# Patient Record
Sex: Male | Born: 1970 | Race: Asian | Hispanic: No | Marital: Married | Smoking: Never smoker
Health system: Southern US, Community
[De-identification: ages and names within clinical notes are randomized; demographics above are authoritative.]

## PROBLEM LIST (undated history)

## (undated) DIAGNOSIS — E7800 Pure hypercholesterolemia, unspecified: Secondary | ICD-10-CM

---

## 2014-07-02 ENCOUNTER — Emergency Department (HOSPITAL_BASED_OUTPATIENT_CLINIC_OR_DEPARTMENT_OTHER): Payer: BLUE CROSS/BLUE SHIELD

## 2014-07-02 ENCOUNTER — Encounter (HOSPITAL_BASED_OUTPATIENT_CLINIC_OR_DEPARTMENT_OTHER): Payer: Self-pay | Admitting: *Deleted

## 2014-07-02 ENCOUNTER — Emergency Department (HOSPITAL_BASED_OUTPATIENT_CLINIC_OR_DEPARTMENT_OTHER)
Admission: EM | Admit: 2014-07-02 | Discharge: 2014-07-02 | Disposition: A | Discharge status: Home / Self Care | Payer: BLUE CROSS/BLUE SHIELD | Attending: Emergency Medicine | Admitting: Emergency Medicine

## 2014-07-02 DIAGNOSIS — E780 Pure hypercholesterolemia: Secondary | ICD-10-CM | POA: Insufficient documentation

## 2014-07-02 DIAGNOSIS — Z880 Allergy status to penicillin: Secondary | ICD-10-CM | POA: Insufficient documentation

## 2014-07-02 DIAGNOSIS — R109 Unspecified abdominal pain: Secondary | ICD-10-CM

## 2014-07-02 DIAGNOSIS — R1011 Right upper quadrant pain: Secondary | ICD-10-CM | POA: Diagnosis not present

## 2014-07-02 HISTORY — DX: Pure hypercholesterolemia, unspecified: E78.00

## 2014-07-02 LAB — COMPREHENSIVE METABOLIC PANEL
ALK PHOS: 59 U/L (ref 38–126)
ALT: 29 U/L (ref 17–63)
AST: 31 U/L (ref 15–41)
Albumin: 4.3 g/dL (ref 3.5–5.0)
Anion gap: 9 (ref 5–15)
BILIRUBIN TOTAL: 0.7 mg/dL (ref 0.3–1.2)
BUN: 8 mg/dL (ref 6–20)
CHLORIDE: 102 mmol/L (ref 101–111)
CO2: 27 mmol/L (ref 22–32)
Calcium: 9.7 mg/dL (ref 8.9–10.3)
Creatinine, Ser: 0.74 mg/dL (ref 0.61–1.24)
GFR calc Af Amer: >60 mL/min (ref >60)
Glucose, Bld: 134 mg/dL — ABNORMAL HIGH (ref 65–99)
Potassium: 3.7 mmol/L (ref 3.5–5.1)
Sodium: 138 mmol/L (ref 135–145)
Total Protein: 6.9 g/dL (ref 6.5–8.1)

## 2014-07-02 LAB — LIPASE, BLOOD: Lipase: 28 U/L (ref 22–51)

## 2014-07-02 MED ORDER — IOHEXOL 300 MG/ML  SOLN
100.0000 mL | Freq: Once | INTRAMUSCULAR | Status: AC | PRN
  Administered 2014-07-02: 100 mL via INTRAVENOUS

## 2014-07-02 MED ORDER — IOHEXOL 300 MG/ML  SOLN
25.0000 mL | Freq: Once | INTRAMUSCULAR | Status: AC | PRN
  Administered 2014-07-02: 25 mL via ORAL

## 2014-07-02 NOTE — Discharge Instructions (Signed)

## 2014-07-02 NOTE — ED Notes (Signed)
Patient transported to Ultrasound 

## 2014-07-02 NOTE — ED Provider Notes (Signed)
CSN: [id number]     Arrival date & time 07/02/14  1626 History   First MD Initiated Contact with Patient 07/02/14 1641     Chief Complaint  Patient presents with  . Abdominal Pain     (Consider location/radiation/quality/duration/timing/severity/associated sxs/prior Treatment) HPI Comments: Pt comes in with acute ruq pain that started this morning. Nothing makes the pain better or worse. No cp, vomiting,sob, fever or diarrhea. No history of similar symptoms. No previous surgery on abdomen. Pt was seen over at [facility] and had labs and urine done and everything looked fine so they sent him here. Not an everyday drinker  The history is provided by the patient. No language interpreter was used.    Past Medical History  Diagnosis Date  . High cholesterol    History reviewed. No pertinent past surgical history. No family history on file. History  Substance Use Topics  . Smoking status: Never Smoker   . Smokeless tobacco: Not on file  . Alcohol Use: Yes     Comment: weekly    Review of Systems  All other systems reviewed and are negative.     Allergies  Penicillins  Home Medications   Prior to Admission medications   Medication Sig Start Date End Date Taking? Authorizing Provider  PRAVASTATIN SODIUM PO Take by mouth.   Yes Historical Provider, MD   BP 123/83 mmHg  Pulse 70  Temp(Src) 98.4 F (36.9 C) (Oral)  Resp 18  Ht 5\' 10"  (1.778 m)  Wt 180 lb (81.647 kg)  BMI 25.83 kg/m2  SpO2 100% Physical Exam  Constitutional: He is oriented to person, place, and time. He appears well-developed and well-nourished.  Cardiovascular: Normal rate and regular rhythm.   Pulmonary/Chest: Effort normal and breath sounds normal.  Abdominal: Soft. Bowel sounds are normal.    Tender to that area  Musculoskeletal: Normal range of motion.  Neurological: He is alert and oriented to person, place, and time.  Skin: Skin is warm and dry.  Psychiatric: He has a normal mood and  affect.  Nursing note and vitals reviewed.   ED Course  Procedures (including critical care time) Labs Review Labs Reviewed  COMPREHENSIVE METABOLIC PANEL - Abnormal; Notable for the following:    Glucose, Bld 134 (*)    All other components within normal limits  LIPASE, BLOOD    Imaging Review [place] Abdomen Complete  07/02/2014   CLINICAL DATA:  Generalized abdominal pain and nausea.  EXAM: ULTRASOUND ABDOMEN COMPLETE  COMPARISON:  None.  FINDINGS: Gallbladder: No gallstones or wall thickening visualized. No sonographic Murphy sign noted.  Common bile duct: Diameter: 0.3 cm.  Liver: Increased echogenicity of the liver. Internal architecture of the liver is poorly defined. Findings are suggestive for hepatic steatosis.  IVC: No abnormality visualized.  Pancreas: Visualized portion unremarkable.  Limited evaluation.  Spleen: Size and appearance within normal limits.  Right Kidney: Length: 10.1 cm. Echogenicity within normal limits. No mass or hydronephrosis visualized.  Left Kidney: Length: 10.4 cm. Echogenicity within normal limits. No mass or hydronephrosis visualized.  Abdominal aorta: No aneurysm visualized.  Other findings: None.  IMPRESSION: No acute abnormality.  Increased echogenicity of the liver may represent hepatic steatosis.   Electronically Signed   By: Richarda OverlieAdam  Henn M.D.   On: 07/02/2014 17:44     EKG Interpretation None      MDM   Final diagnoses:  RUQ discomfort  Abdominal pain, unspecified abdominal location    Cbc and urine normal from [facility]  uc.no definite reason for symptoms. Pt denies need for pain medication at home. Given gi referral for symptoms    Teressa LowerVrinda Modest Draeger, NP 07/02/14 1919  Vanetta MuldersScott Zackowski, MD 07/03/14 1404

## 2014-07-02 NOTE — ED Notes (Signed)
Abdominal since 11am. [facility] and [facility] could not find [facility] cause.

## 2016-09-22 IMAGING — US US ABDOMEN COMPLETE
1 series · 14 of 25 positions shown · non-contrast
Comparison: None.

CLINICAL DATA: Generalized abdominal pain and nausea.

EXAM:
ULTRASOUND ABDOMEN COMPLETE

[Series 1: us abdomen complete · 0.17mm/px · 14 of 66 slices shown]
[im 1/66]
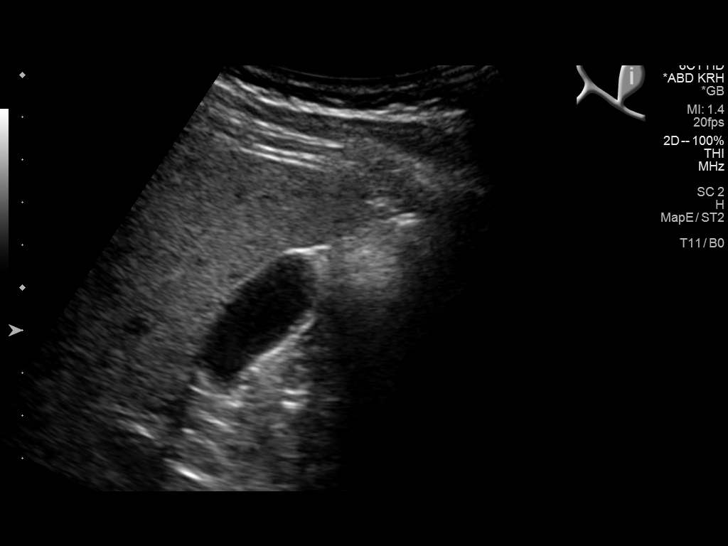
[im 6/66]
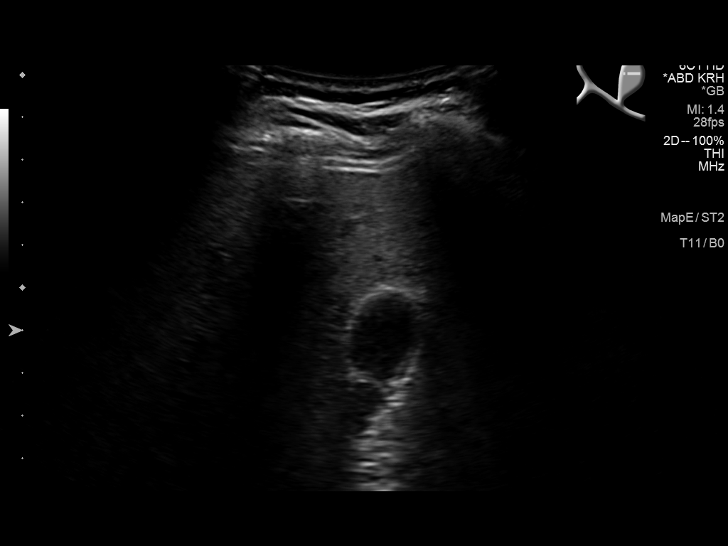
[im 11/66]
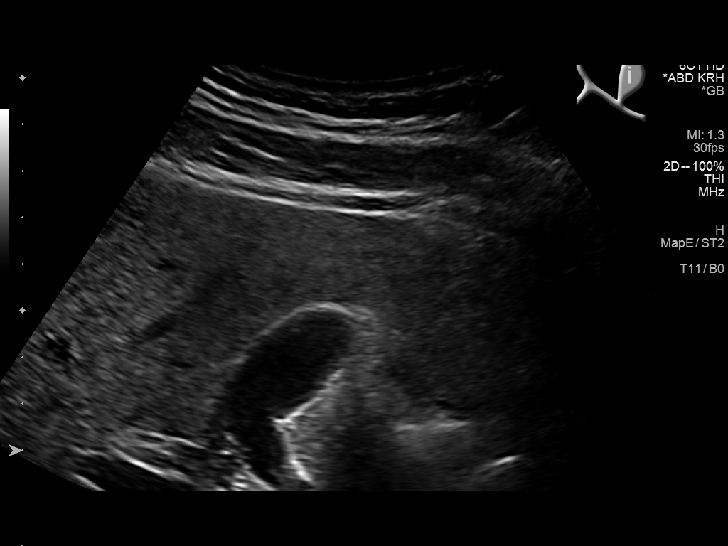
[im 17/66]
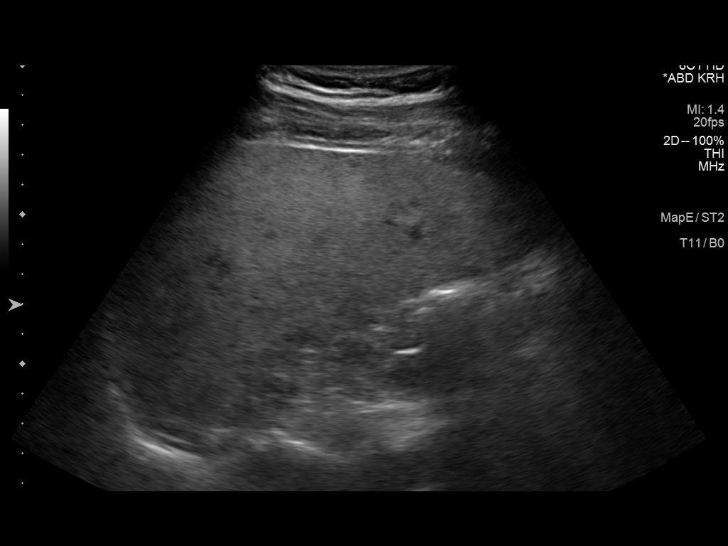
[im 22/66]
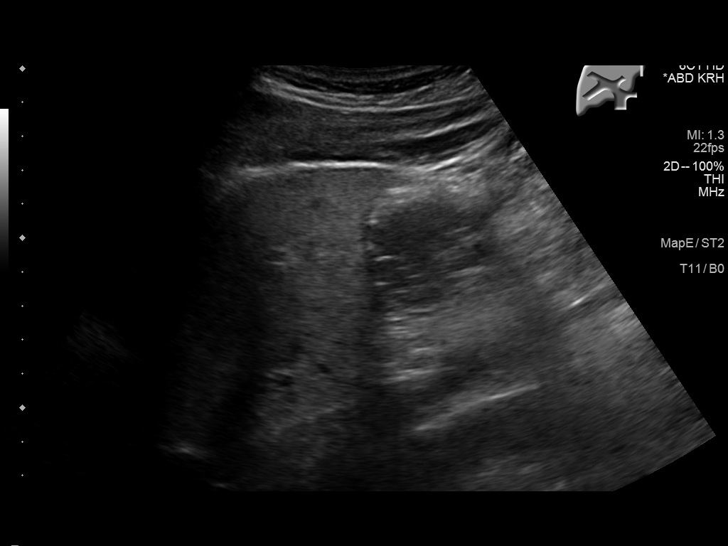
[im 25/66]
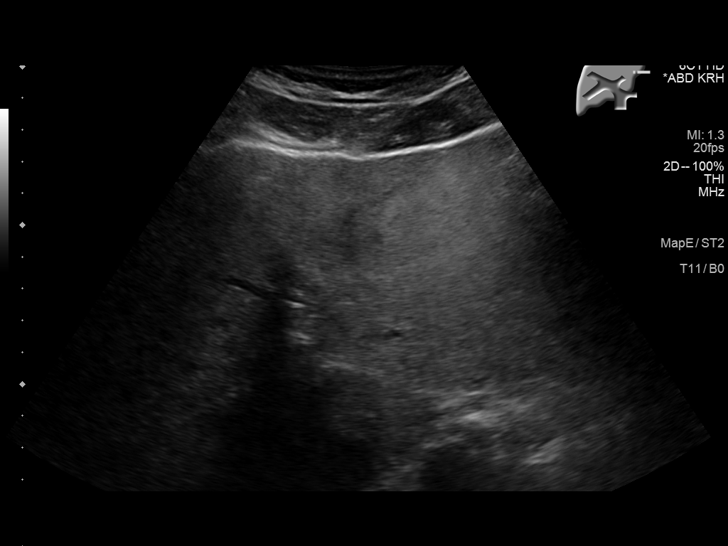
[im 30/66]
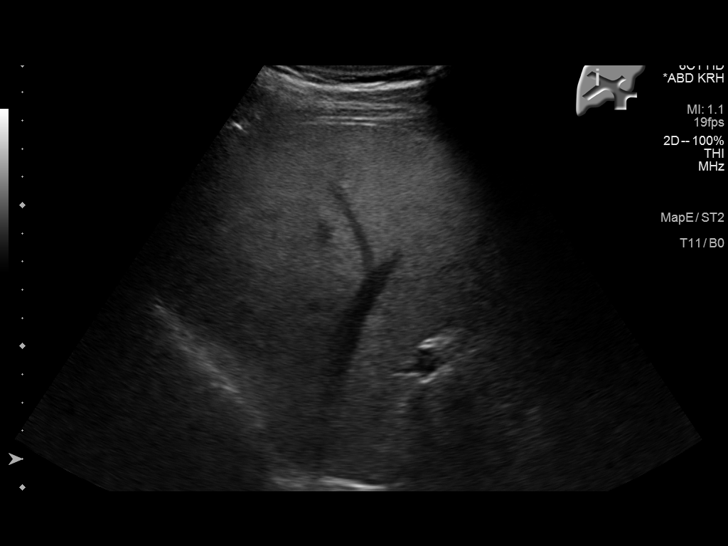
[im 36/66]
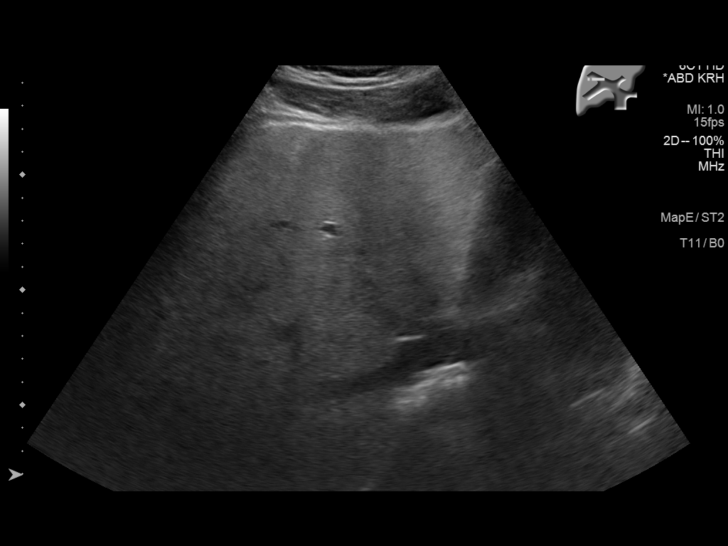
[im 41/66]
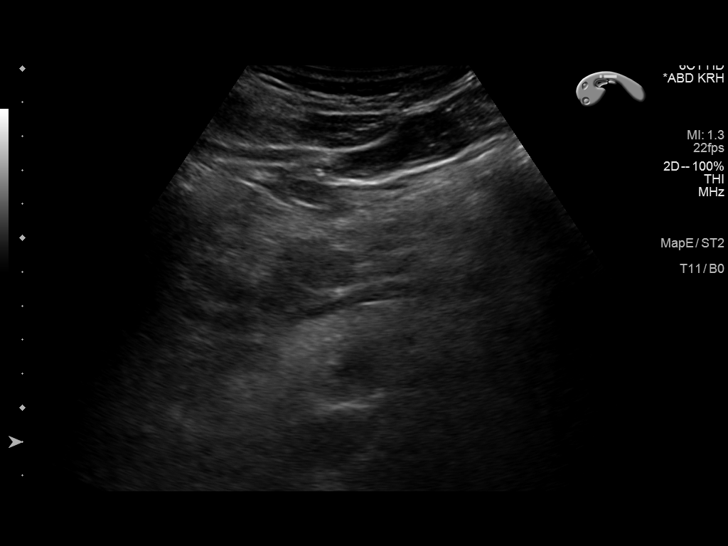
[im 44/66]
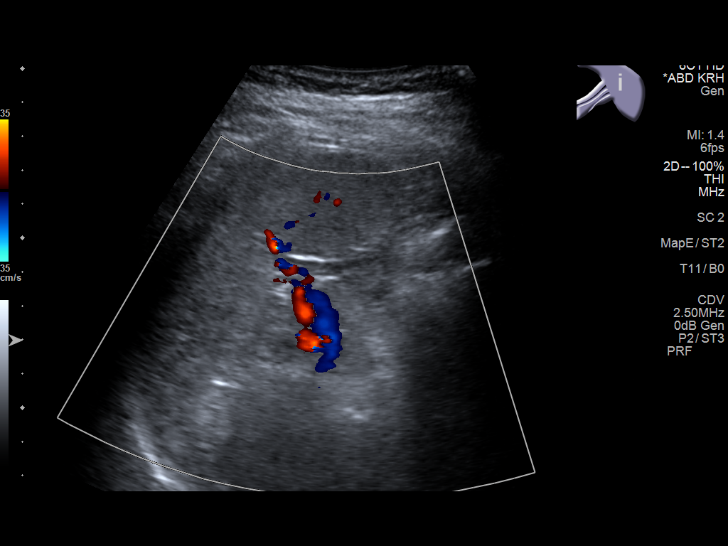
[im 49/66]
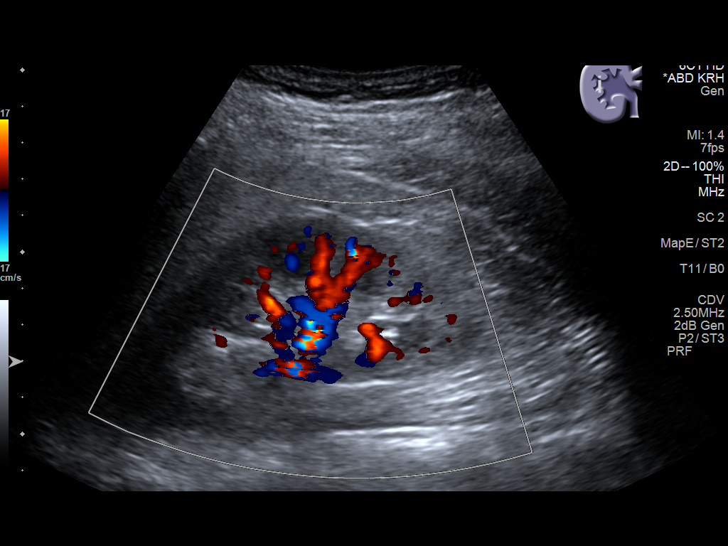
[im 55/66]
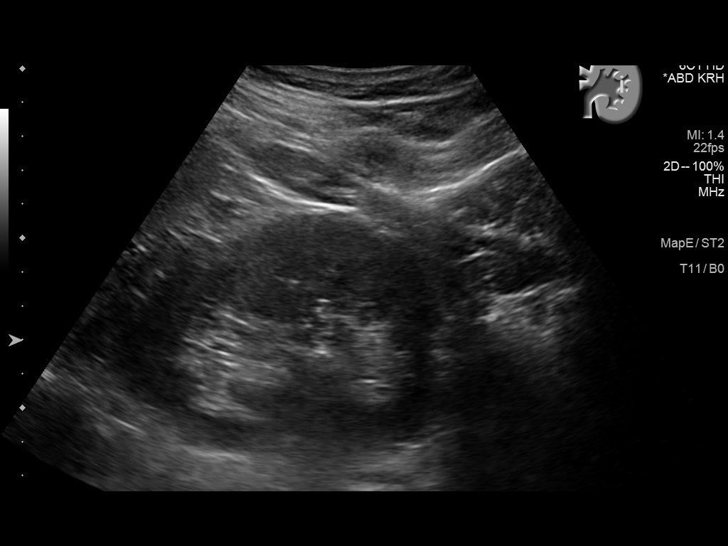
[im 60/66]
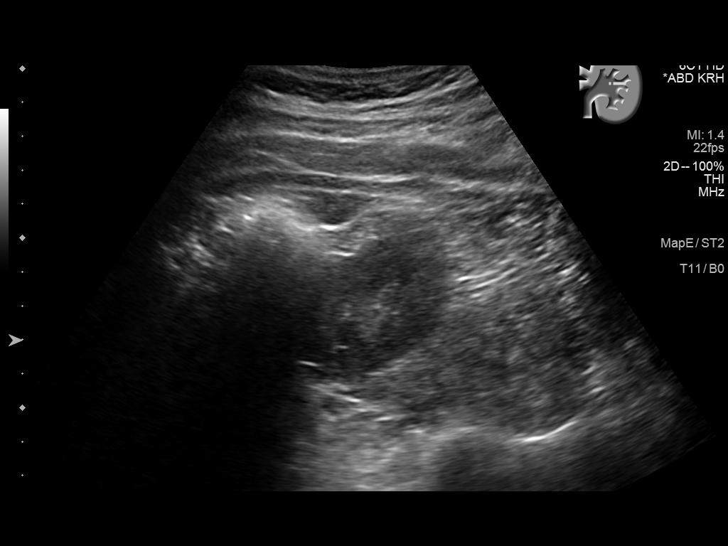
[im 66/66]
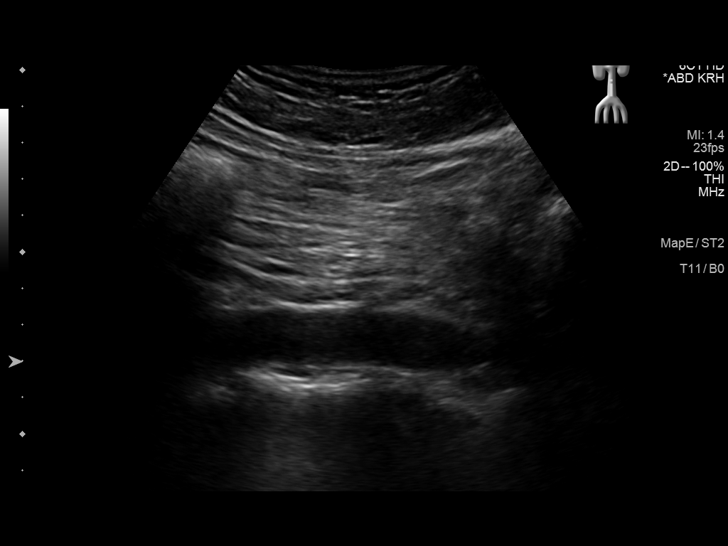

[14 of 25 positions shown; findings below may reference images not displayed]

FINDINGS: Gallbladder: No gallstones or wall thickening visualized. No
sonographic Murphy sign noted.

Common bile duct: Diameter: 0.3 cm.

Liver: Increased echogenicity of the liver. Internal architecture of
the liver is poorly defined. Findings are suggestive for hepatic
steatosis.

IVC: No abnormality visualized.

Pancreas: Visualized portion unremarkable.  Limited evaluation.

Spleen: Size and appearance within normal limits.

Right Kidney: Length: 10.1 cm. Echogenicity within normal limits. No
mass or hydronephrosis visualized.

Left Kidney: Length: 10.4 cm. Echogenicity within normal limits. No
mass or hydronephrosis visualized.

Abdominal aorta: No aneurysm visualized.

Other findings: None.
IMPRESSION: No acute abnormality.

Increased echogenicity of the liver may represent hepatic steatosis.
